# Patient Record
Sex: Male | Born: 1950 | Hispanic: No | Marital: Married | Smoking: Former smoker
Health system: Southern US, Community
[De-identification: ages and names within clinical notes are randomized; demographics above are authoritative.]

## PROBLEM LIST (undated history)

## (undated) DIAGNOSIS — M5030 Other cervical disc degeneration, unspecified cervical region: Secondary | ICD-10-CM

## (undated) DIAGNOSIS — E785 Hyperlipidemia, unspecified: Secondary | ICD-10-CM

## (undated) DIAGNOSIS — M1990 Unspecified osteoarthritis, unspecified site: Secondary | ICD-10-CM

## (undated) DIAGNOSIS — L409 Psoriasis, unspecified: Secondary | ICD-10-CM

## (undated) HISTORY — PX: VASECTOMY: SHX75

## (undated) HISTORY — PX: COLONOSCOPY: SHX174

---

## 2005-04-04 ENCOUNTER — Ambulatory Visit: Payer: Self-pay | Admitting: Internal Medicine

## 2005-08-12 ENCOUNTER — Ambulatory Visit: Payer: Self-pay | Admitting: Gastroenterology

## 2007-05-14 ENCOUNTER — Ambulatory Visit: Payer: Self-pay | Admitting: Internal Medicine

## 2008-05-30 ENCOUNTER — Ambulatory Visit: Payer: Self-pay | Admitting: Internal Medicine

## 2011-06-02 ENCOUNTER — Ambulatory Visit: Payer: Self-pay | Admitting: Internal Medicine

## 2011-06-14 ENCOUNTER — Encounter: Payer: Self-pay | Admitting: Physician Assistant

## 2011-06-24 ENCOUNTER — Encounter: Payer: Self-pay | Admitting: Physician Assistant

## 2011-07-25 ENCOUNTER — Encounter: Payer: Self-pay | Admitting: Physician Assistant

## 2011-08-24 ENCOUNTER — Encounter: Payer: Self-pay | Admitting: Physician Assistant

## 2011-09-24 ENCOUNTER — Encounter: Payer: Self-pay | Admitting: Physician Assistant

## 2012-06-15 ENCOUNTER — Other Ambulatory Visit: Payer: Self-pay | Admitting: Internal Medicine

## 2012-06-15 LAB — CBC WITH DIFFERENTIAL/PLATELET
Basophil #: 0.1 10*3/uL (ref 0.0–0.1)
Eosinophil #: 0.2 10*3/uL (ref 0.0–0.7)
Eosinophil %: 2.5 %
HCT: 44.6 % (ref 40.0–52.0)
MCH: 30.2 pg (ref 26.0–34.0)
MCHC: 34 g/dL (ref 32.0–36.0)
MCV: 89 fL (ref 80–100)
Monocyte #: 0.8 x10 3/mm (ref 0.2–1.0)
Neutrophil %: 46.5 %
Platelet: 290 10*3/uL (ref 150–440)
RBC: 5.02 10*6/uL (ref 4.40–5.90)
RDW: 12.9 % (ref 11.5–14.5)
WBC: 7.1 10*3/uL (ref 3.8–10.6)

## 2012-06-15 LAB — URINALYSIS, COMPLETE
Glucose,UR: NEGATIVE mg/dL (ref 0–75)
Ketone: NEGATIVE
Leukocyte Esterase: NEGATIVE
Ph: 6 (ref 4.5–8.0)
Protein: NEGATIVE
Specific Gravity: 1.014 (ref 1.003–1.030)
Squamous Epithelial: NONE SEEN
WBC UR: <1 /HPF (ref 0–5)

## 2012-06-15 LAB — COMPREHENSIVE METABOLIC PANEL
Anion Gap: 5 — ABNORMAL LOW (ref 7–16)
Calcium, Total: 8.8 mg/dL (ref 8.5–10.1)
Chloride: 104 mmol/L (ref 98–107)
Co2: 29 mmol/L (ref 21–32)
Creatinine: 0.92 mg/dL (ref 0.60–1.30)
EGFR (African American): >60
EGFR (Non-African Amer.): >60
SGOT(AST): 26 U/L (ref 15–37)
SGPT (ALT): 40 U/L (ref 12–78)

## 2012-06-15 LAB — TSH: Thyroid Stimulating Horm: 2.67 u[IU]/mL

## 2012-06-16 LAB — PSA: PSA: 2.3 ng/mL (ref 0.0–4.0)

## 2013-02-19 IMAGING — CR DG SHOULDER 3+V*L*
1 series · 4 of 4 positions shown · non-contrast
Comparison: none

REASON FOR EXAM: left shoulder pain
COMMENTS:

PROCEDURE:     DXR - DXR SHOULDER LEFT COMPLETE  - June 02, 2011 [DATE]
RESULT:     No fracture, dislocation or other acute bony abnormality is
identified. No lytic or blastic lesions are seen. The acromioclavicular
joint is normal in appearance.

[Series 1: w shoulder external left · 0.14mm/px · 4 of 4 slices shown]
[im 1/4]
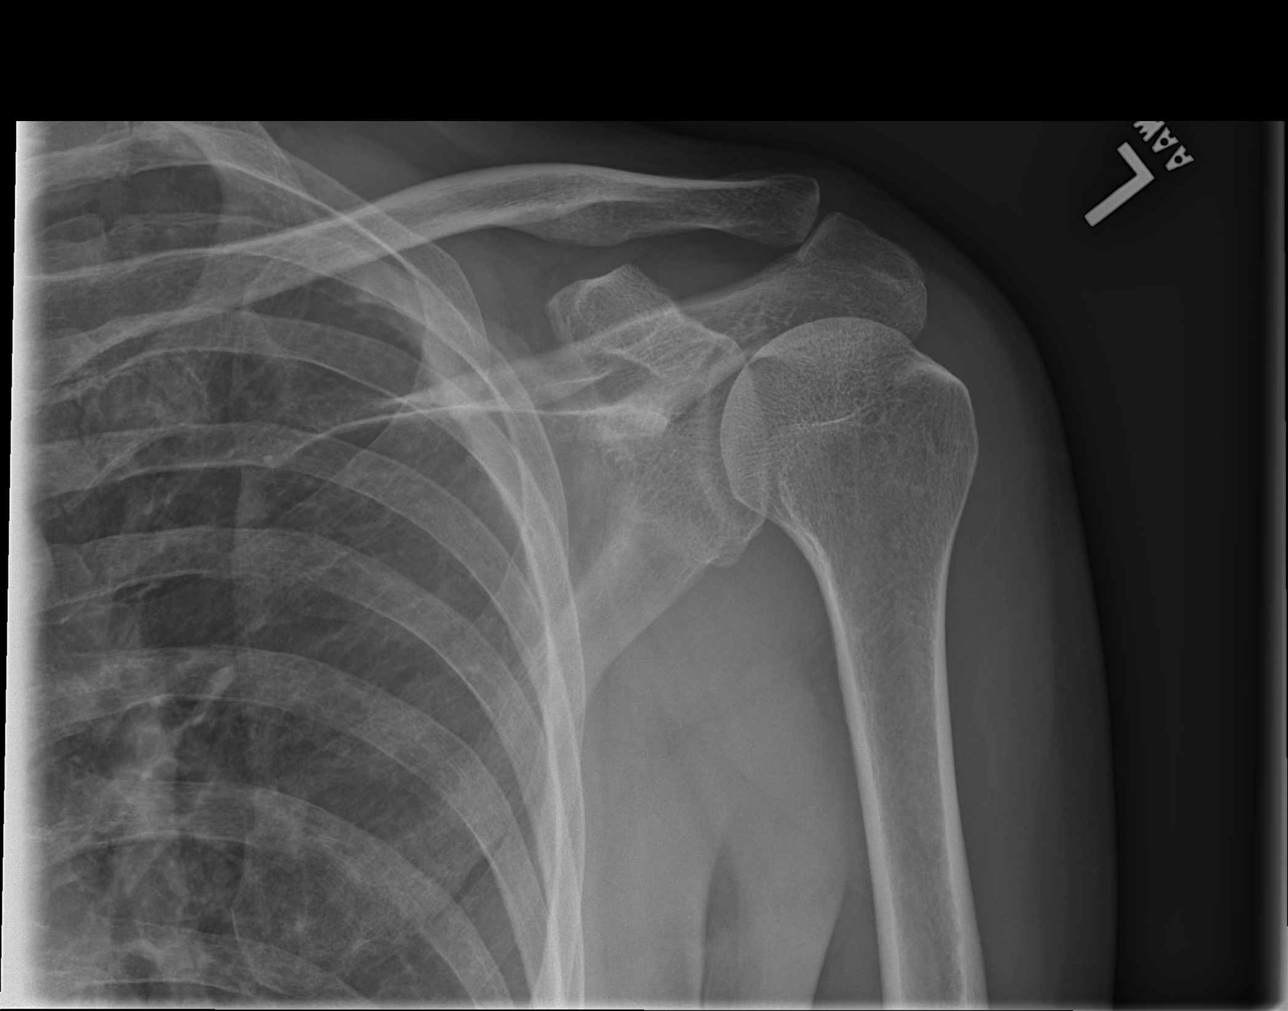
[im 2/4]
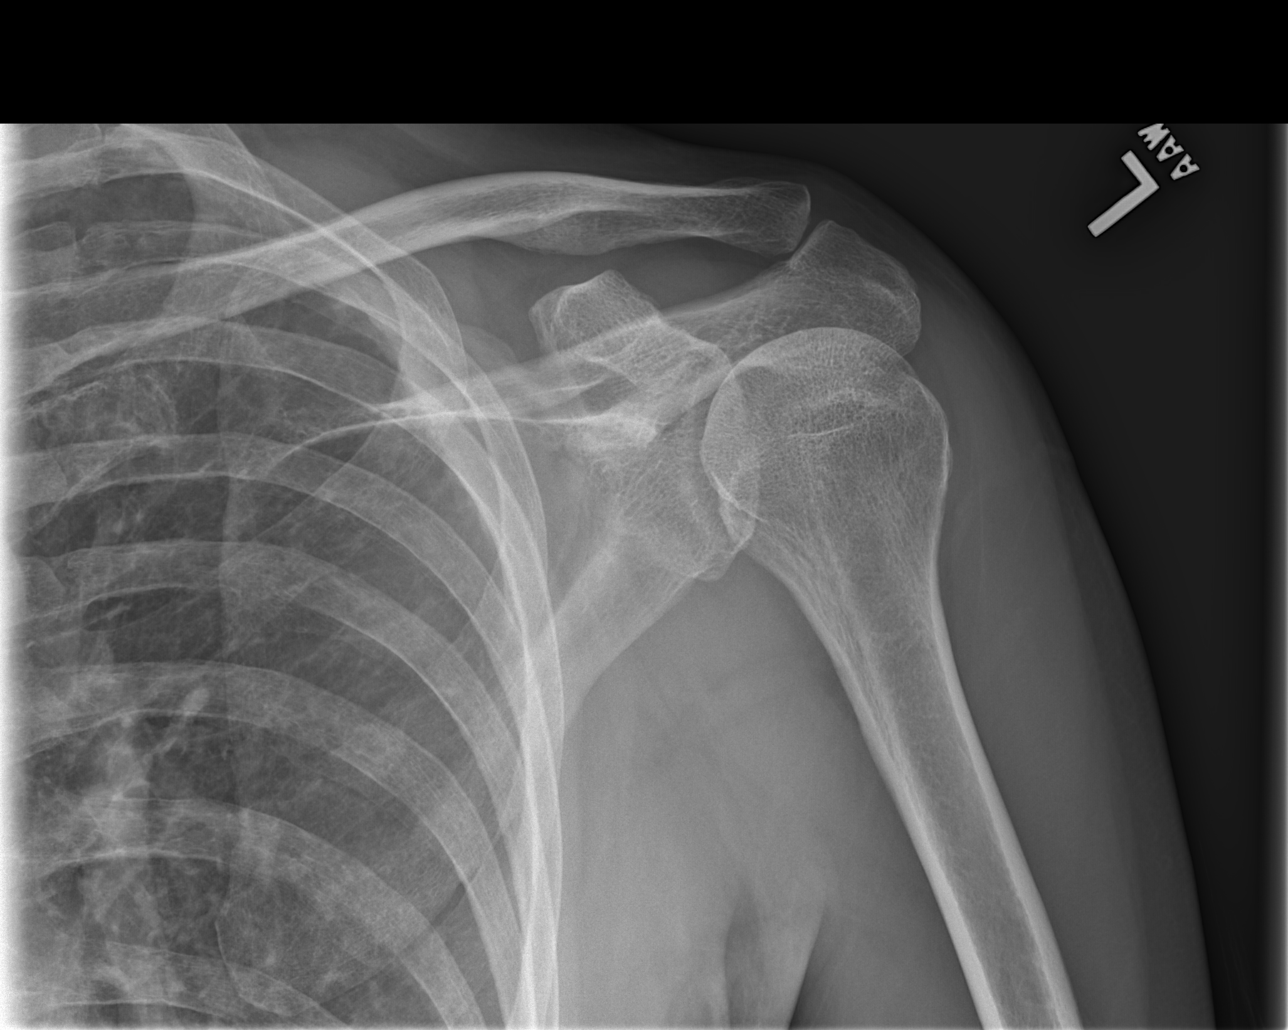
[im 3/4]
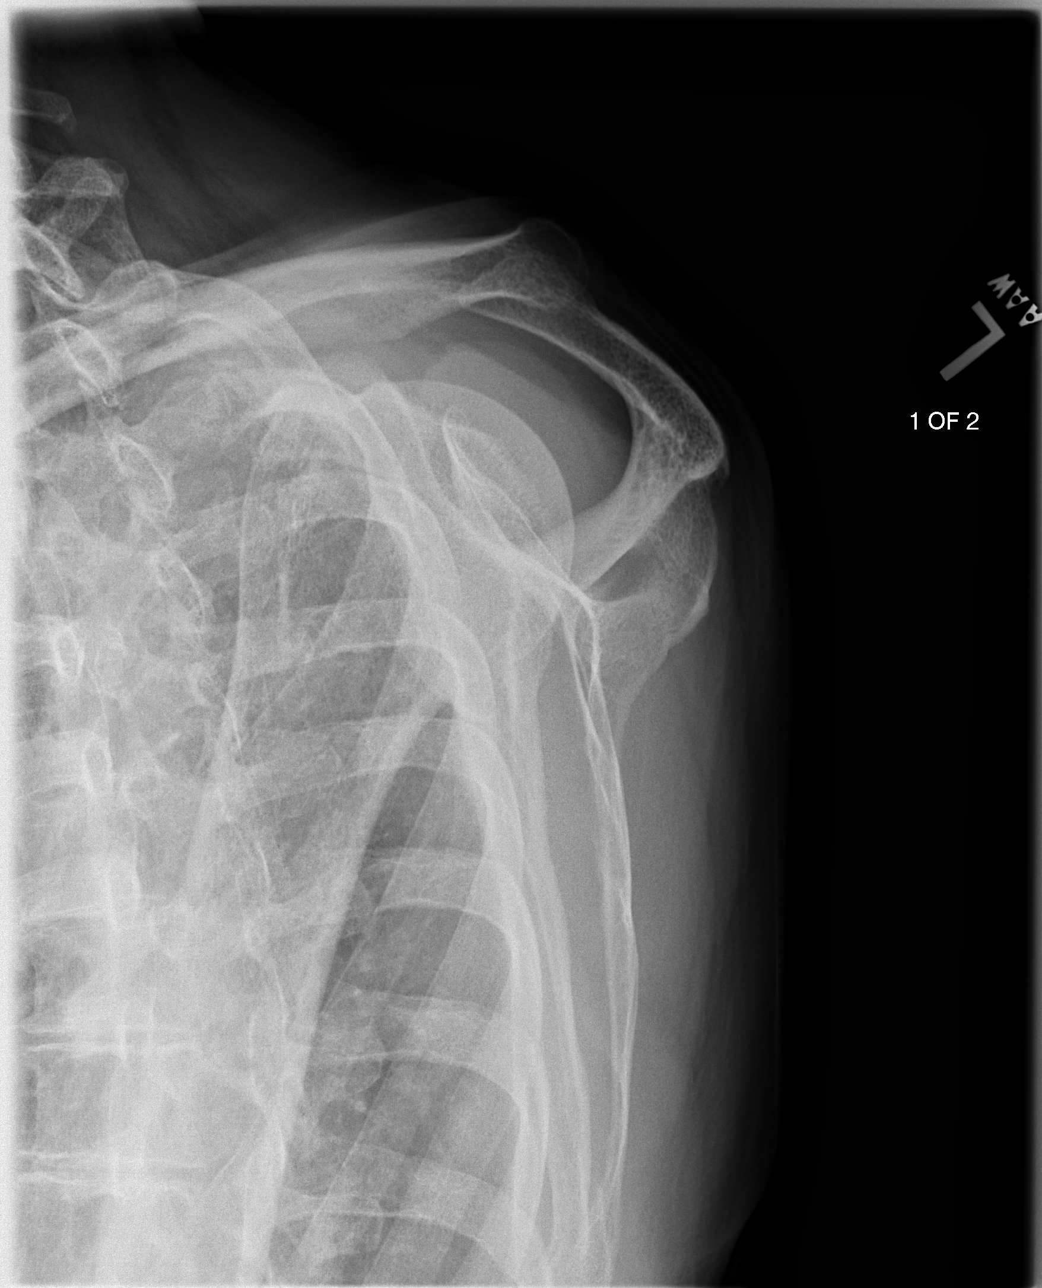
[im 4/4]
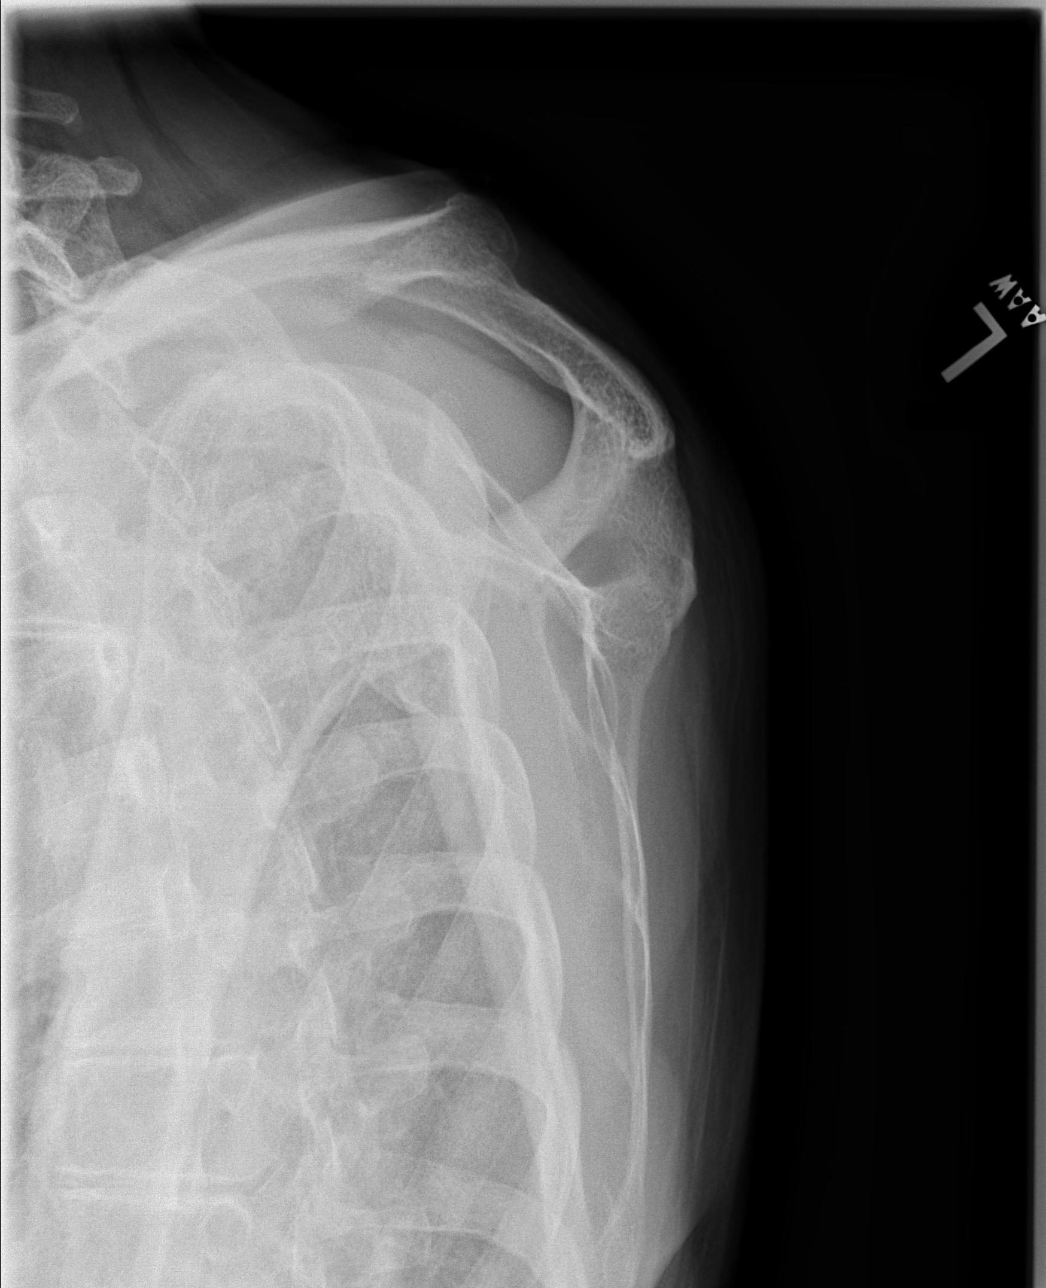

[4 of 4 positions shown; findings below may reference images not displayed]

IMPRESSION: 1.     No significant abnormalities are noted.

## 2013-06-24 ENCOUNTER — Other Ambulatory Visit: Payer: Self-pay | Admitting: Internal Medicine

## 2013-06-24 LAB — BASIC METABOLIC PANEL
ANION GAP: 1 — AB (ref 7–16)
BUN: 17 mg/dL (ref 7–18)
CHLORIDE: 104 mmol/L (ref 98–107)
Calcium, Total: 9.1 mg/dL (ref 8.5–10.1)
Co2: 30 mmol/L (ref 21–32)
Creatinine: 0.89 mg/dL (ref 0.60–1.30)
EGFR (Non-African Amer.): >60
Glucose: 87 mg/dL (ref 65–99)
Osmolality: 271 (ref 275–301)
POTASSIUM: 4.3 mmol/L (ref 3.5–5.1)
Sodium: 135 mmol/L — ABNORMAL LOW (ref 136–145)

## 2013-06-24 LAB — URINALYSIS, COMPLETE
Bacteria: NONE SEEN
Bilirubin,UR: NEGATIVE
Blood: NEGATIVE
GLUCOSE, UR: NEGATIVE mg/dL (ref 0–75)
Ketone: NEGATIVE
Leukocyte Esterase: NEGATIVE
NITRITE: NEGATIVE
Ph: 6 (ref 4.5–8.0)
Protein: NEGATIVE
RBC,UR: NONE SEEN /HPF (ref 0–5)
SPECIFIC GRAVITY: 1.014 (ref 1.003–1.030)
Squamous Epithelial: 1
WBC UR: 1 /HPF (ref 0–5)

## 2013-06-24 LAB — CBC WITH DIFFERENTIAL/PLATELET
BASOS ABS: 0.1 10*3/uL (ref 0.0–0.1)
Basophil %: 1.3 %
Eosinophil #: 0.5 10*3/uL (ref 0.0–0.7)
Eosinophil %: 7.2 %
HCT: 43.3 % (ref 40.0–52.0)
HGB: 14.4 g/dL (ref 13.0–18.0)
Lymphocyte #: 2.3 10*3/uL (ref 1.0–3.6)
Lymphocyte %: 31.5 %
MCH: 29.6 pg (ref 26.0–34.0)
MCHC: 33.4 g/dL (ref 32.0–36.0)
MCV: 89 fL (ref 80–100)
MONO ABS: 0.7 x10 3/mm (ref 0.2–1.0)
MONOS PCT: 10 %
NEUTROS PCT: 50 %
Neutrophil #: 3.6 10*3/uL (ref 1.4–6.5)
Platelet: 282 10*3/uL (ref 150–440)
RBC: 4.87 10*6/uL (ref 4.40–5.90)
RDW: 12.6 % (ref 11.5–14.5)
WBC: 7.3 10*3/uL (ref 3.8–10.6)

## 2013-06-24 LAB — HEPATIC FUNCTION PANEL A (ARMC)
ALBUMIN: 3.6 g/dL (ref 3.4–5.0)
ALK PHOS: 58 U/L
BILIRUBIN TOTAL: 0.3 mg/dL (ref 0.2–1.0)
SGOT(AST): 23 U/L (ref 15–37)
SGPT (ALT): 32 U/L (ref 12–78)
Total Protein: 7.2 g/dL (ref 6.4–8.2)

## 2013-06-24 LAB — TSH: Thyroid Stimulating Horm: 2.07 u[IU]/mL

## 2013-06-24 LAB — LIPID PANEL
CHOLESTEROL: 186 mg/dL (ref 0–200)
HDL Cholesterol: 40 mg/dL (ref 40–60)
Ldl Cholesterol, Calc: 124 mg/dL — ABNORMAL HIGH (ref 0–100)
Triglycerides: 109 mg/dL (ref 0–200)
VLDL CHOLESTEROL, CALC: 22 mg/dL (ref 5–40)

## 2013-06-25 LAB — PSA: PSA: 2.3 ng/mL (ref 0.0–4.0)

## 2015-12-17 ENCOUNTER — Encounter: Payer: Self-pay | Admitting: *Deleted

## 2015-12-18 ENCOUNTER — Ambulatory Visit: Payer: 59 | Admitting: Anesthesiology

## 2015-12-18 ENCOUNTER — Encounter
Admission: RE | Disposition: A | Discharge status: Home / Self Care | Payer: Self-pay | Source: Ambulatory Visit | Attending: Gastroenterology

## 2015-12-18 ENCOUNTER — Ambulatory Visit
Admission: RE | Admit: 2015-12-18 | Discharge: 2015-12-18 | Disposition: A | Discharge status: Home / Self Care | Payer: 59 | Source: Ambulatory Visit | Attending: Gastroenterology | Admitting: Gastroenterology

## 2015-12-18 ENCOUNTER — Encounter: Payer: Self-pay | Admitting: Anesthesiology

## 2015-12-18 DIAGNOSIS — M5030 Other cervical disc degeneration, unspecified cervical region: Secondary | ICD-10-CM | POA: Diagnosis not present

## 2015-12-18 DIAGNOSIS — Z1211 Encounter for screening for malignant neoplasm of colon: Secondary | ICD-10-CM | POA: Diagnosis not present

## 2015-12-18 DIAGNOSIS — Z79899 Other long term (current) drug therapy: Secondary | ICD-10-CM | POA: Diagnosis not present

## 2015-12-18 DIAGNOSIS — K621 Rectal polyp: Secondary | ICD-10-CM | POA: Insufficient documentation

## 2015-12-18 DIAGNOSIS — E785 Hyperlipidemia, unspecified: Secondary | ICD-10-CM | POA: Diagnosis not present

## 2015-12-18 DIAGNOSIS — M1990 Unspecified osteoarthritis, unspecified site: Secondary | ICD-10-CM | POA: Insufficient documentation

## 2015-12-18 DIAGNOSIS — Z87891 Personal history of nicotine dependence: Secondary | ICD-10-CM | POA: Insufficient documentation

## 2015-12-18 DIAGNOSIS — L409 Psoriasis, unspecified: Secondary | ICD-10-CM | POA: Diagnosis not present

## 2015-12-18 HISTORY — DX: Unspecified osteoarthritis, unspecified site: M19.90

## 2015-12-18 HISTORY — PX: COLONOSCOPY WITH PROPOFOL: SHX5780

## 2015-12-18 HISTORY — DX: Hyperlipidemia, unspecified: E78.5

## 2015-12-18 HISTORY — DX: Other cervical disc degeneration, unspecified cervical region: M50.30

## 2015-12-18 HISTORY — DX: Psoriasis, unspecified: L40.9

## 2015-12-18 SURGERY — COLONOSCOPY WITH PROPOFOL
Anesthesia: General

## 2015-12-18 MED ORDER — PROPOFOL 10 MG/ML IV BOLUS
INTRAVENOUS | Status: DC | PRN
  Administered 2015-12-18: 40 mg via INTRAVENOUS

## 2015-12-18 MED ORDER — FENTANYL CITRATE (PF) 100 MCG/2ML IJ SOLN
INTRAMUSCULAR | Status: DC | PRN
  Administered 2015-12-18: 50 ug via INTRAVENOUS

## 2015-12-18 MED ORDER — SODIUM CHLORIDE 0.9 % IV SOLN
INTRAVENOUS | Status: DC
  Administered 2015-12-18: 08:00:00 via INTRAVENOUS
  Administered 2015-12-18: 1000 mL via INTRAVENOUS

## 2015-12-18 MED ORDER — LIDOCAINE HCL (PF) 2 % IJ SOLN
INTRAMUSCULAR | Status: DC | PRN
  Administered 2015-12-18: 40 mg via INTRADERMAL

## 2015-12-18 MED ORDER — DEXAMETHASONE SODIUM PHOSPHATE 10 MG/ML IJ SOLN
INTRAMUSCULAR | Status: DC | PRN
  Administered 2015-12-18: 140 mg via INTRAVENOUS

## 2015-12-18 MED ORDER — EPHEDRINE SULFATE 50 MG/ML IJ SOLN
INTRAMUSCULAR | Status: DC | PRN
  Administered 2015-12-18: 10 mg via INTRAVENOUS

## 2015-12-18 MED ORDER — MIDAZOLAM HCL 2 MG/2ML IJ SOLN
INTRAMUSCULAR | Status: DC | PRN
  Administered 2015-12-18: 1 mg via INTRAVENOUS

## 2015-12-18 MED ORDER — SODIUM CHLORIDE 0.9 % IV SOLN: INTRAVENOUS | Status: DC

## 2015-12-18 NOTE — Op Note (Signed)
[facility] Gastroenterology Patient Name: Billy Ryan Procedure Date: 12/18/2015 8:30 AM MRN: [id number] Account #: [id number] Date of Birth: Feb 19, 1951 Admit Type: Outpatient Age: [age] Room: [facility] END[facility] ROOM 1 Gender: Male Note Status: Finalized Procedure:            Colonoscopy Providers:            Christena DeemMartin U. Skulskie, MD Referring MD:         Daniel NonesBert Klein, MD (Referring MD) Medicines:            Monitored Anesthesia Care Complications:        No immediate complications. Procedure:            Pre-Anesthesia Assessment:                       - ASA Grade Assessment: I - A normal, healthy patient.                       After obtaining informed consent, the colonoscope was                        passed under direct vision. Throughout the procedure,                        the patient's blood pressure, pulse, and oxygen                        saturations were monitored continuously. The                        Colonoscope was introduced through the anus and                        advanced to the the cecum, identified by appendiceal                        orifice and ileocecal valve. The colonoscopy was                        performed with moderate difficulty. Successful                        completion of the procedure was aided by changing the                        patient to a prone position. The patient tolerated the                        procedure well. The quality of the bowel preparation                        was good. Findings:      A 3 mm polyp was found in the rectum. The polyp was sessile. The polyp       was removed with a cold biopsy forceps. Resection and retrieval were       complete.      The exam was otherwise without abnormality.      No additional abnormalities were found on retroflexion.      The digital rectal exam was normal. Impression:           -  One 3 mm polyp in the rectum, removed with a cold                        biopsy forceps.  Resected and retrieved.                       - The examination was otherwise normal. Recommendation:       - Discharge patient to home. Procedure Code(s):    --- Professional ---                       [phone], Colonoscopy, flexible; with biopsy, single or                        multiple Diagnosis Code(s):    --- Professional ---                       K62.1, Rectal polyp CPT copyright 2016 American Medical Association. All rights reserved. The codes documented in this report are preliminary and upon coder review may  be revised to meet current compliance requirements. Christena Deem, MD 12/18/2015 9:10:09 AM This report has been signed electronically. Number of Addenda: 0 Note Initiated On: 12/18/2015 8:30 AM Scope Withdrawal Time: 0 hours 9 minutes 38 seconds  Total Procedure Duration: 0 hours 22 minutes 56 seconds       [facility]

## 2015-12-18 NOTE — H&P (Signed)
Outpatient short stay form Pre-procedure 12/18/2015 8:26 AM Billy DeemMartin U Skulskie MD  Primary Physician: Daniel NonesBert Klein  Reason for visit: Colonoscopy   History of present illness:  Patient is a 65 year old male presenting today as above. He tolerated his prep well. He takes no aspirin or blood thinning products.     Current Facility-Administered Medications:  .  0.9 %  sodium chloride infusion, , Intravenous, Continuous, Billy DeemMartin U Skulskie, MD, Last Rate: 20 mL/hr at 12/18/15 0755, 1,000 mL at 12/18/15 0755 .  0.9 %  sodium chloride infusion, , Intravenous, Continuous, Billy DeemMartin U Skulskie, MD  Prescriptions Prior to Admission  Medication Sig Dispense Refill Last Dose  . ibuprofen (ADVIL,MOTRIN) 200 MG tablet Take 200 mg by mouth every 6 (six) hours as needed.   Past Week at Unknown time  . polyethylene glycol powder (GLYCOLAX/MIRALAX) powder Take 1 Container by mouth once.   12/17/2015 at Unknown time  . sildenafil (VIAGRA) 100 MG tablet Take 100 mg by mouth daily as needed for erectile dysfunction.   Past Month at Unknown time     No Known Allergies   Past Medical History:  Diagnosis Date  . DDD (degenerative disc disease), cervical    MULTIPLE SITES  . Hyperlipemia   . Osteoarthritis    RT.SHOULDER  . Psoriasis     Review of systems:      Physical Exam    Heart and lungs: Rate and rhythm without rub or gallop, lungs are bilaterally clear.    HEENT: Normocephalic atraumatic eyes are anicteric    Other:     Pertinant exam for procedure: Soft nontender nondistended bowel sounds positive normoactive.    Planned proceedures: Colonoscopy and indicated procedures.have discussed the risks benefits and complications of procedures to include not limited to bleeding, infection, perforation and the risk of sedation and the patient wishes to proceed.    Billy DeemMartin U Skulskie, MD Gastroenterology 12/18/2015  8:26 AM

## 2015-12-18 NOTE — Anesthesia Postprocedure Evaluation (Signed)
Anesthesia Post Note  Patient: Billy LeydenChristopher M Ryan  Procedure(s) Performed: Procedure(s) (LRB): COLONOSCOPY WITH PROPOFOL (N/A)  Patient location during evaluation: Endoscopy Anesthesia Type: General Level of consciousness: awake and alert Pain management: pain level controlled Vital Signs Assessment: post-procedure vital signs reviewed and stable Respiratory status: spontaneous breathing, nonlabored ventilation, respiratory function stable and patient connected to nasal cannula oxygen Cardiovascular status: blood pressure returned to baseline and stable Postop Assessment: no signs of nausea or vomiting Anesthetic complications: no    Last Vitals:  Vitals:   12/18/15 0940 12/18/15 0950  BP: 115/69 113/76  Pulse: 69 60  Resp: 16 15  Temp:      Last Pain:  Vitals:   12/18/15 0917  TempSrc: Tympanic                 Lenard SimmerAndrew Bryceton Hantz

## 2015-12-18 NOTE — Transfer of Care (Signed)
Immediate Anesthesia Transfer of Care Note  Patient: Billy LeydenChristopher M Cerutti  Procedure(s) Performed: Procedure(s): COLONOSCOPY WITH PROPOFOL (N/A)  Patient Location: PACU  Anesthesia Type:General  Level of Consciousness: sedated  Airway & Oxygen Therapy: Patient Spontanous Breathing and Patient connected to nasal cannula oxygen  Post-op Assessment: Report given to RN and Post -op Vital signs reviewed and stable  Post vital signs: Reviewed and stable  Last Vitals:  Vitals:   12/18/15 0739  BP: 104/68  Pulse: 73  Resp: 16  Temp: (!) 35.9 C    Last Pain:  Vitals:   12/18/15 0739  TempSrc: Tympanic         Complications: No apparent anesthesia complications

## 2015-12-18 NOTE — Anesthesia Procedure Notes (Signed)
Date/Time: 12/18/2015 8:50 AM Performed by: Henrietta HooverPOPE, Trishna Cwik Pre-anesthesia Checklist: Patient identified, Emergency Drugs available, Suction available, Patient being monitored and Timeout performed Patient Re-evaluated:Patient Re-evaluated prior to inductionOxygen Delivery Method: Nasal cannula Intubation Type: IV induction

## 2015-12-18 NOTE — Anesthesia Preprocedure Evaluation (Signed)
Anesthesia Evaluation  Patient identified by MRN, date of birth, ID band Patient awake    Reviewed: Allergy & Precautions, H&P , NPO status , Patient's Chart, lab work & pertinent test results, reviewed documented beta blocker date and time   History of Anesthesia Complications Negative for: history of anesthetic complications  Airway Mallampati: I  TM Distance: >3 FB Neck ROM: full    Dental no notable dental hx. (+) Caps, Poor Dentition   Pulmonary neg pulmonary ROS, former smoker,    Pulmonary exam normal breath sounds clear to auscultation       Cardiovascular Exercise Tolerance: Good negative cardio ROS Normal cardiovascular exam Rhythm:regular Rate:Normal     Neuro/Psych negative neurological ROS  negative psych ROS   GI/Hepatic negative GI ROS, Neg liver ROS,   Endo/Other  negative endocrine ROS  Renal/GU negative Renal ROS  negative genitourinary   Musculoskeletal   Abdominal   Peds  Hematology negative hematology ROS (+)   Anesthesia Other Findings Past Medical History: No date: DDD (degenerative disc disease), cervical     Comment: MULTIPLE SITES No date: Hyperlipemia No date: Osteoarthritis     Comment: RT.SHOULDER No date: Psoriasis   Reproductive/Obstetrics negative OB ROS                             Anesthesia Physical Anesthesia Plan  ASA: I  Anesthesia Plan: General   Post-op Pain Management:    Induction:   Airway Management Planned:   Additional Equipment:   Intra-op Plan:   Post-operative Plan:   Informed Consent: I have reviewed the patients History and Physical, chart, labs and discussed the procedure including the risks, benefits and alternatives for the proposed anesthesia with the patient or authorized representative who has indicated his/her understanding and acceptance.   Dental Advisory Given  Plan Discussed with: Anesthesiologist, CRNA  and Surgeon  Anesthesia Plan Comments:         Anesthesia Quick Evaluation

## 2015-12-21 ENCOUNTER — Encounter: Payer: Self-pay | Admitting: Gastroenterology

## 2015-12-21 LAB — SURGICAL PATHOLOGY

## 2016-03-04 ENCOUNTER — Emergency Department (INDEPENDENT_AMBULATORY_CARE_PROVIDER_SITE_OTHER): Payer: 59

## 2016-03-04 ENCOUNTER — Emergency Department (INDEPENDENT_AMBULATORY_CARE_PROVIDER_SITE_OTHER)
Admission: EM | Admit: 2016-03-04 | Discharge: 2016-03-04 | Disposition: A | Discharge status: Home / Self Care | Payer: 59 | Source: Home / Self Care | Attending: Family Medicine | Admitting: Family Medicine

## 2016-03-04 ENCOUNTER — Encounter: Payer: Self-pay | Admitting: Emergency Medicine

## 2016-03-04 DIAGNOSIS — R05 Cough: Secondary | ICD-10-CM | POA: Diagnosis not present

## 2016-03-04 DIAGNOSIS — J40 Bronchitis, not specified as acute or chronic: Secondary | ICD-10-CM | POA: Diagnosis not present

## 2016-03-04 DIAGNOSIS — R509 Fever, unspecified: Secondary | ICD-10-CM | POA: Diagnosis not present

## 2016-03-04 LAB — POCT RAPID STREP A (OFFICE): RAPID STREP A SCREEN: NEGATIVE

## 2016-03-04 MED ORDER — AZITHROMYCIN 250 MG PO TABS: ORAL_TABLET | ORAL | 0 refills | Status: AC

## 2016-03-04 NOTE — ED Provider Notes (Signed)
[facility]    CSN: [id number] Arrival date & time: 03/04/16  0854     History   Chief Complaint Chief Complaint  Patient presents with  . Sore Throat    HPI Billy LeydenChristopher M Ryan is a 65 y.o. male.   About 9 days ago patient developed typical cold-like symptoms developing over several days, including mild sore throat, sinus congestion, headache, fatigue, and cough.  During the past 2 to 3 days he has become increasingly fatigued with myalgias, and temperature last night increased to 101.  His cough is poorly productive, and awakens him at night.  No pleuritic pain or shortness of breath.   The history is provided by the patient.    Past Medical History:  Diagnosis Date  . DDD (degenerative disc disease), cervical    MULTIPLE SITES  . Hyperlipemia   . Osteoarthritis    RT.SHOULDER  . Psoriasis     There are no active problems to display for this patient.   Past Surgical History:  Procedure Laterality Date  . COLONOSCOPY    . COLONOSCOPY WITH PROPOFOL N/A 12/18/2015   Procedure: COLONOSCOPY WITH PROPOFOL;  Surgeon: Christena DeemMartin U Skulskie, MD;  [facility] ENDOSCOPY;  Service: Endoscopy;  Laterality: N/A;  . VASECTOMY         Home Medications    Prior to Admission medications   Medication Sig Start Date End Date Taking? Authorizing Provider  Dextromethorphan-Guaifenesin (DELSYM COUGH/CHEST CONGEST DM PO) Take by mouth.   Yes Historical Provider, MD  pseudoephedrine (SUDAFED) 30 MG tablet Take 30 mg by mouth every 4 (four) hours as needed for congestion.   Yes Historical Provider, MD  azithromycin (ZITHROMAX Z-PAK) 250 MG tablet Take 2 tabs today; then begin one tab once daily for 4 more days. 03/04/16   Lattie HawStephen A Mattheu Brodersen, MD  ibuprofen (ADVIL,MOTRIN) 200 MG tablet Take 200 mg by mouth every 6 (six) hours as needed.    Historical Provider, MD  polyethylene glycol powder (GLYCOLAX/MIRALAX) powder Take 1 Container by mouth once.    Historical Provider, MD    sildenafil (VIAGRA) 100 MG tablet Take 100 mg by mouth daily as needed for erectile dysfunction.    Historical Provider, MD    Family History No family history on file.  Social History Social History  Substance Use Topics  . Smoking status: Former Games developermoker  . Smokeless tobacco: Never Used  . Alcohol use Yes     Allergies   Patient has no known allergies.   Review of Systems Review of Systems + sore throat, improved + cough No pleuritic pain No wheezing + nasal congestion + post-nasal drainage No sinus pain/pressure No itchy/red eyes No earache No hemoptysis No SOB + fever, + chills No nausea No vomiting No abdominal pain No diarrhea No urinary symptoms No skin rash + fatigue + myalgias + headache Used OTC meds without relief   Physical Exam Triage Vital Signs ED Triage Vitals  Enc Vitals Group     BP 03/04/16 0921 116/66     Pulse Rate 03/04/16 0921 102     Resp --      Temp 03/04/16 0921 98.3 F (36.8 C)     Temp Source 03/04/16 0921 Oral     SpO2 03/04/16 0921 97 %     Weight 03/04/16 0921 174 lb (78.9 kg)     Height 03/04/16 0921 6\' 2"  (1.88 m)     Head Circumference --      Peak Flow --  Pain Score 03/04/16 0924 2     Pain Loc --      Pain Edu? --      Excl. in GC? --    No data found.   Updated Vital Signs BP 116/66 (BP Location: Left Arm)   Pulse 102   Temp 98.3 F (36.8 C) (Oral)   Ht 6\' 2"  (1.88 m)   Wt 174 lb (78.9 kg)   SpO2 97%   BMI 22.34 kg/m   Visual Acuity Right Eye Distance:   Left Eye Distance:   Bilateral Distance:    Right Eye Near:   Left Eye Near:    Bilateral Near:     Physical Exam Nursing notes and Vital Signs reviewed. Appearance:  Patient appears stated age, and in no acute distress Eyes:  Pupils are equal, round, and reactive to light and accomodation.  Extraocular movement is intact.  Conjunctivae are not inflamed  Ears:  Canals normal.  Tympanic membranes normal.  Nose:  Mildly congested  turbinates.  No sinus tenderness.  Pharynx:  Normal Neck:  Supple.  Tender enlarged posterior/lateral nodes are palpated bilaterally  Lungs:  Clear to auscultation.  Breath sounds are equal.  Moving air well. Heart:  Regular rate and rhythm without murmurs, rubs, or gallops.  Rate 108 Abdomen:  Nontender without masses or hepatosplenomegaly.  Bowel sounds are present.  No CVA or flank tenderness.  Extremities:  No edema.  Skin:  No rash present.    UC Treatments / Results  Labs (all labs ordered are listed, but only abnormal results are displayed) POCT rapid strep test negative  EKG  EKG Interpretation None       Radiology Dg Chest 2 View  Result Date: 03/04/2016 CLINICAL DATA:  Cough and fever. EXAM: CHEST  2 VIEW COMPARISON:  No recent prior . FINDINGS: Mediastinum is normal. Bilateral upper lobe nodular pleural parenchymal thickening with retraction of the hila noted consistent with scarring, possibly from prior granulomas disease. No focal alveolar infiltrate. No pleural effusion or pneumothorax. No acute bony abnormality . IMPRESSION: Bilateral upper lobe nodular pleural-parenchymal thickening with hilar retraction consistent with scarring, possibly from prior granulomas is a. No acute abnormality . Electronically Signed   By: Maisie Fus  Register   On: 03/04/2016 09:50    Procedures Procedures (including critical care time)  Medications Ordered in UC Medications - No data to display   Initial Impression / Assessment and Plan / UC Course  I have reviewed the triage vital signs and the nursing notes.  Pertinent labs & imaging results that were available during my care of the patient were reviewed by me and considered in my medical decision making (see chart for details).  Clinical Course   Suspect viral URI initially; late onset fever with increase cough worrisome for developing bacterial bronchitis. Will begin Z-pak for atypical coverage. Take plain guaifenesin (1200mg   extended release tabs such as Mucinex) twice daily, with plenty of water, for cough and congestion.  May add Pseudoephedrine (30mg , one or two every 4 to 6 hours) for sinus congestion.  Get adequate rest.   Stop all antihistamines for now, and other non-prescription cough/cold preparations. May take Ibuprofen 200mg , 4 tabs every 8 hours with food for fever, body aches, etc. May take Delsym Cough Suppressant at bedtime for nighttime cough.  Follow-up with family doctor if not improving about one week.     Final Clinical Impressions(s) / UC Diagnoses   Final diagnoses:  Bronchitis    New Prescriptions New  Prescriptions   AZITHROMYCIN (ZITHROMAX Z-PAK) 250 MG TABLET    Take 2 tabs today; then begin one tab once daily for 4 more days.     Lattie HawStephen A Sierria Bruney, MD 03/04/16 1020

## 2016-03-04 NOTE — Discharge Instructions (Signed)
Take plain guaifenesin (1200mg  extended release tabs such as Mucinex) twice daily, with plenty of water, for cough and congestion.  May add Pseudoephedrine (30mg , one or two every 4 to 6 hours) for sinus congestion.  Get adequate rest.   Stop all antihistamines for now, and other non-prescription cough/cold preparations. May take Ibuprofen 200mg , 4 tabs every 8 hours with food for fever, body aches, etc. May take Delsym Cough Suppressant at bedtime for nighttime cough.  Follow-up with family doctor if not improving about one week.

## 2016-03-04 NOTE — ED Triage Notes (Signed)
Sore throat, fever, productive cough, body aches x 6 days

## 2016-03-06 ENCOUNTER — Telehealth: Payer: Self-pay | Admitting: Emergency Medicine

## 2016-03-06 NOTE — Telephone Encounter (Signed)
Patient states he is feeling better

## 2017-06-04 ENCOUNTER — Emergency Department (INDEPENDENT_AMBULATORY_CARE_PROVIDER_SITE_OTHER)
Admission: EM | Admit: 2017-06-04 | Discharge: 2017-06-04 | Disposition: A | Discharge status: Home / Self Care | Payer: BLUE CROSS/BLUE SHIELD | Source: Home / Self Care

## 2017-06-04 ENCOUNTER — Encounter: Payer: Self-pay | Admitting: Emergency Medicine

## 2017-06-04 DIAGNOSIS — J0120 Acute ethmoidal sinusitis, unspecified: Secondary | ICD-10-CM

## 2017-06-04 MED ORDER — AMOXICILLIN-POT CLAVULANATE 875-125 MG PO TABS: 1.0000 | ORAL_TABLET | Freq: Two times a day (BID) | ORAL | 0 refills | Status: AC

## 2017-06-04 NOTE — ED Provider Notes (Signed)
[facility]    CSN: [id number] Arrival date & time: 06/04/17  1646     History   Chief Complaint Chief Complaint  Patient presents with  . Cough  . Nasal Congestion  . Headache  . Generalized Body Aches  . Fever    HPI Billy LeydenChristopher M Ryan is a 67 y.o. male.   The history is provided by the patient. No language interpreter was used.  Cough  Cough characteristics:  Productive Sputum characteristics:  Nondescript Severity:  Moderate Onset quality:  Gradual Duration:  1 week Timing:  Constant Progression:  Worsening Chronicity:  New Smoker: no   Relieved by:  Nothing Worsened by:  Nothing Associated symptoms: fever and headaches   Headache  Associated symptoms: cough and fever   Fever  Associated symptoms: cough and headaches     Past Medical History:  Diagnosis Date  . DDD (degenerative disc disease), cervical    MULTIPLE SITES  . Hyperlipemia   . Osteoarthritis    RT.SHOULDER  . Psoriasis     There are no active problems to display for this patient.   Past Surgical History:  Procedure Laterality Date  . COLONOSCOPY    . COLONOSCOPY WITH PROPOFOL N/A 12/18/2015   Procedure: COLONOSCOPY WITH PROPOFOL;  Surgeon: Christena DeemMartin U Skulskie, MD;  [facility] END[facility]COPY;  Service: Endoscopy;  Laterality: N/A;  . VASECTOMY         Home Medications    Prior to Admission medications   Medication Sig Start Date End Date Taking? Authorizing Provider  amoxicillin-clavulanate (AUGMENTIN) 875-125 MG tablet Take 1 tablet by mouth every 12 (twelve) hours. 06/04/17   Elson AreasSofia, Mardy Lucier K, PA-C  azithromycin (ZITHROMAX Z-PAK) 250 MG tablet Take 2 tabs today; then begin one tab once daily for 4 more days. 03/04/16   Lattie HawBeese, Stephen A, MD  Dextromethorphan-Guaifenesin (DELSYM COUGH/CHEST CONGEST DM PO) Take by mouth.    [provider]  ibuprofen (ADVIL,MOTRIN) 200 MG tablet Take 200 mg by mouth every 6 (six) hours as needed.    [provider]    polyethylene glycol powder (GLYCOLAX/MIRALAX) powder Take 1 Container by mouth once.    [provider]  pseudoephedrine (SUDAFED) 30 MG tablet Take 30 mg by mouth every 4 (four) hours as needed for congestion.    [provider]  sildenafil (VIAGRA) 100 MG tablet Take 100 mg by mouth daily as needed for erectile dysfunction.    [provider]    Family History History reviewed. No pertinent family history.  Social History Social History   Tobacco Use  . Smoking status: Former Games developermoker  . Smokeless tobacco: Never Used  Substance Use Topics  . Alcohol use: Yes  . Drug use: No     Allergies   Patient has no known allergies.   Review of Systems Review of Systems  Constitutional: Positive for fever.  Respiratory: Positive for cough.   Neurological: Positive for headaches.  All other systems reviewed and are negative.    Physical Exam Triage Vital Signs ED Triage Vitals  Enc Vitals Group     BP 06/04/17 1723 (!) 88/57     Pulse Rate 06/04/17 1723 80     Resp 06/04/17 1723 16     Temp 06/04/17 1723 98.8 F (37.1 C)     Temp Source 06/04/17 1723 Oral     SpO2 06/04/17 1723 98 %     Weight 06/04/17 1725 160 lb (72.6 kg)     Height 06/04/17 1725 6'  2" (1.88 m)     Head Circumference --      Peak Flow --      Pain Score 06/04/17 1724 6     Pain Loc --      Pain Edu? --      Excl. in GC? --    No data found.  Updated Vital Signs BP 95/60   Pulse 80   Temp 98.8 F (37.1 C) (Oral)   Resp 16   Ht 6\' 2"  (1.88 m)   Wt 160 lb (72.6 kg)   SpO2 98%   BMI 20.54 kg/m   Visual Acuity Right Eye Distance:   Left Eye Distance:   Bilateral Distance:    Right Eye Near:   Left Eye Near:    Bilateral Near:     Physical Exam  Constitutional: He appears well-developed and well-nourished.  HENT:  Head: Normocephalic and atraumatic.  Mouth/Throat: Oropharynx is clear and moist.  Eyes: Conjunctivae and EOM are normal. Pupils are equal, round,  and reactive to light.  Neck: Neck supple.  Cardiovascular: Normal rate and regular rhythm.  No murmur heard. Pulmonary/Chest: Effort normal and breath sounds normal. No respiratory distress.  Abdominal: Soft. There is no tenderness.  Musculoskeletal: He exhibits no edema.  Neurological: He is alert.  Skin: Skin is warm and dry.  Psychiatric: He has a normal mood and affect.  Nursing note and vitals reviewed.    UC Treatments / Results  Labs (all labs ordered are listed, but only abnormal results are displayed) Labs Reviewed - No data to display  EKG  EKG Interpretation None       Radiology No results found.  Procedures Procedures (including critical care time)  Medications Ordered in UC Medications - No data to display   Initial Impression / Assessment and Plan / UC Course  I have reviewed the triage vital signs and the nursing notes.  Pertinent labs & imaging results that were available during my care of the patient were reviewed by me and considered in my medical decision making (see chart for details).     MDM  Pt has had sinus symptoms for several weeks,  Pt now has a cough.  He reports fever this am.  BP low.  Pt feels normal no symptoms no tachycardia.  He reports he is drinking plenty of fluids.  I doubt influenza.  I am going to cover with Augmentin for sinustitis.  Final Clinical Impressions(s) / UC Diagnoses   Final diagnoses:  Acute ethmoidal sinusitis, recurrence not specified    ED Discharge Orders        Ordered    amoxicillin-clavulanate (AUGMENTIN) 875-125 MG tablet  Every 12 hours     06/04/17 1730     An After Visit Summary was printed and given to the patient.   Controlled Substance Prescriptions [place] Controlled Substance Registry consulted? Not Applicable   Elson Areas, [place] 06/04/17 1734

## 2017-06-04 NOTE — Discharge Instructions (Signed)
See your Physician or return here for recheck if symptoms persist past one week.

## 2017-06-04 NOTE — ED Triage Notes (Signed)
[facility] , cold ,sinus pressure ,headache, body aches, fever, Dry cough with occasional production. Chest congestion.

## 2017-11-22 IMAGING — DX DG CHEST 2V
2 series · 2 of 2 positions shown · non-contrast
Comparison: No recent prior .

CLINICAL DATA: Cough and fever.

EXAM:
CHEST  2 VIEW

[chest pa]
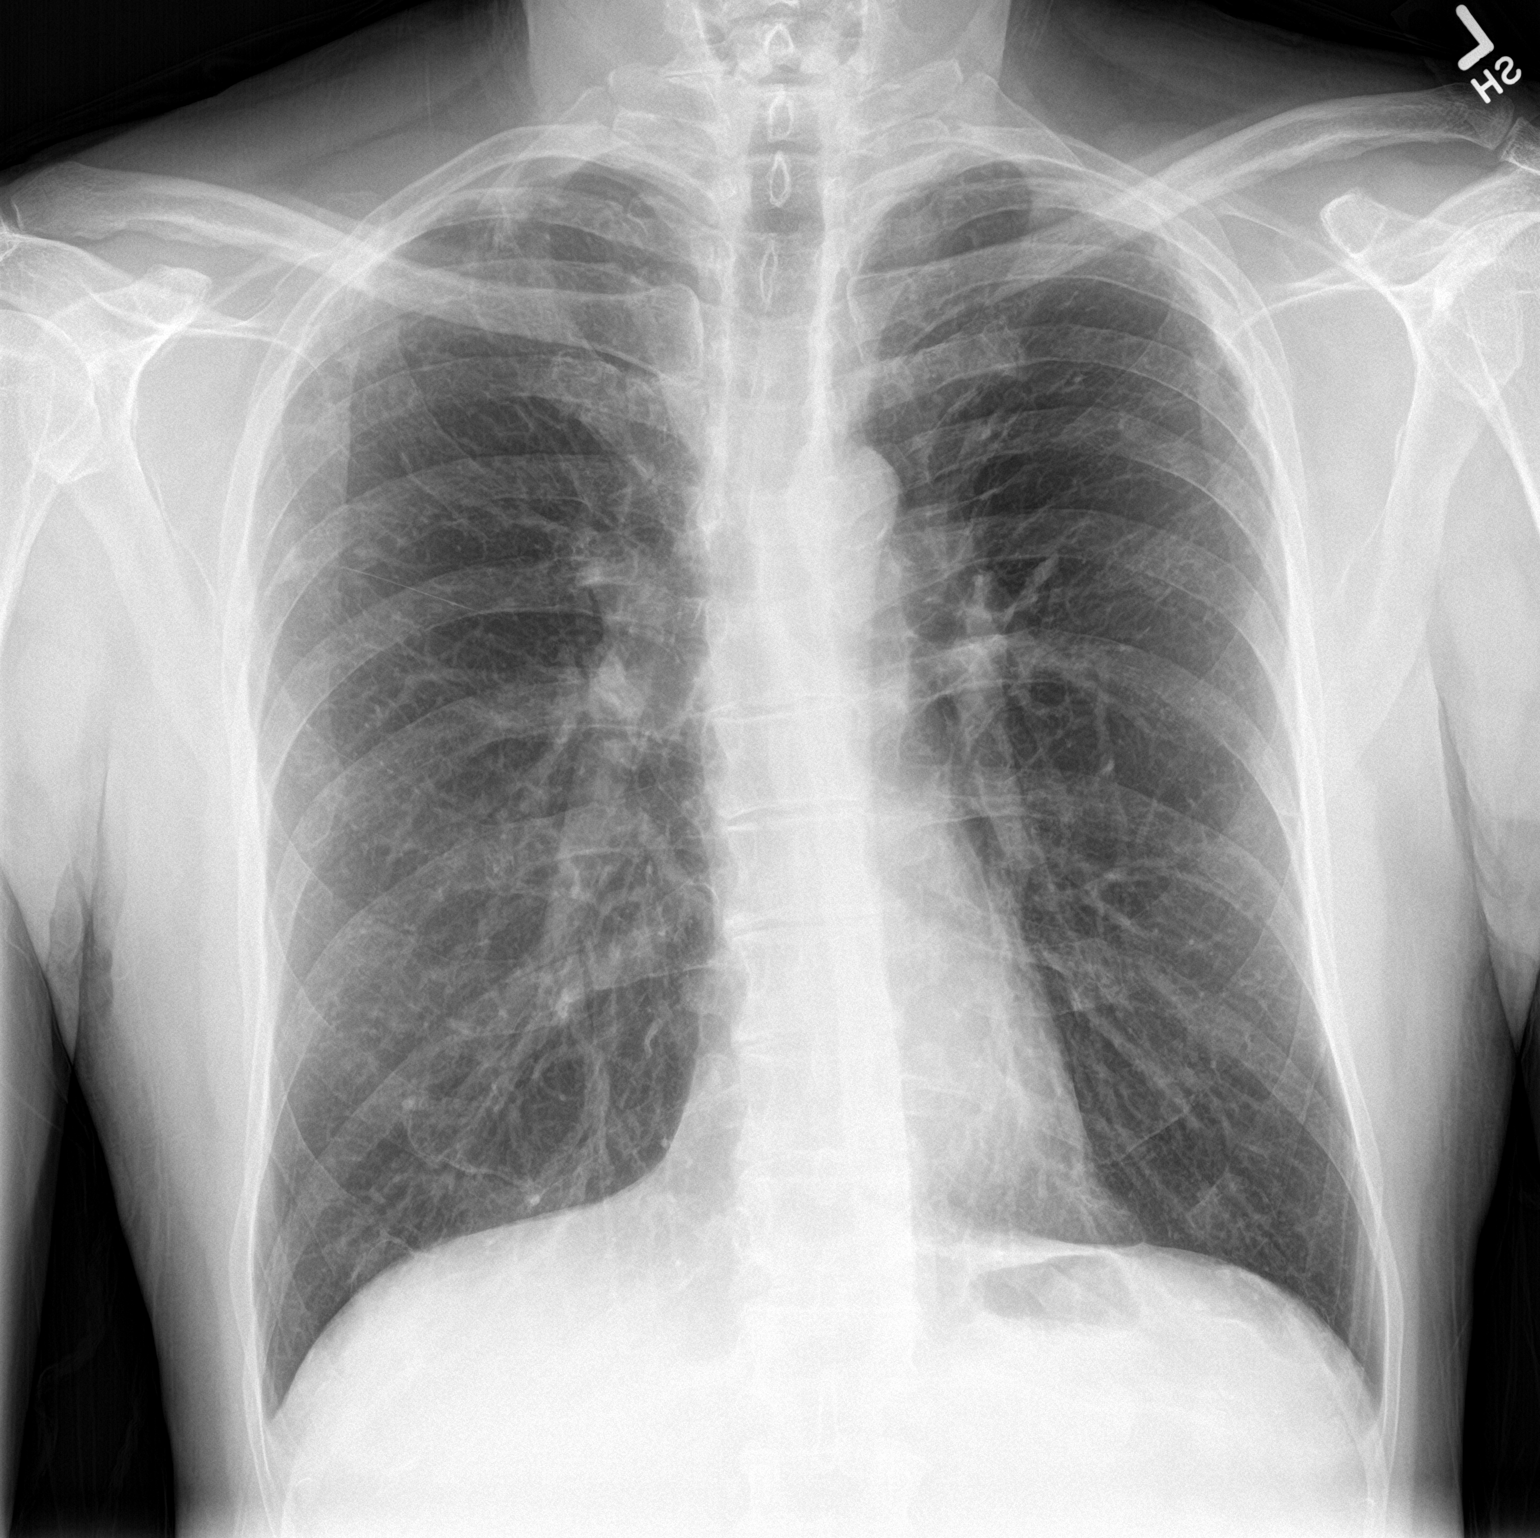

[chest lat]
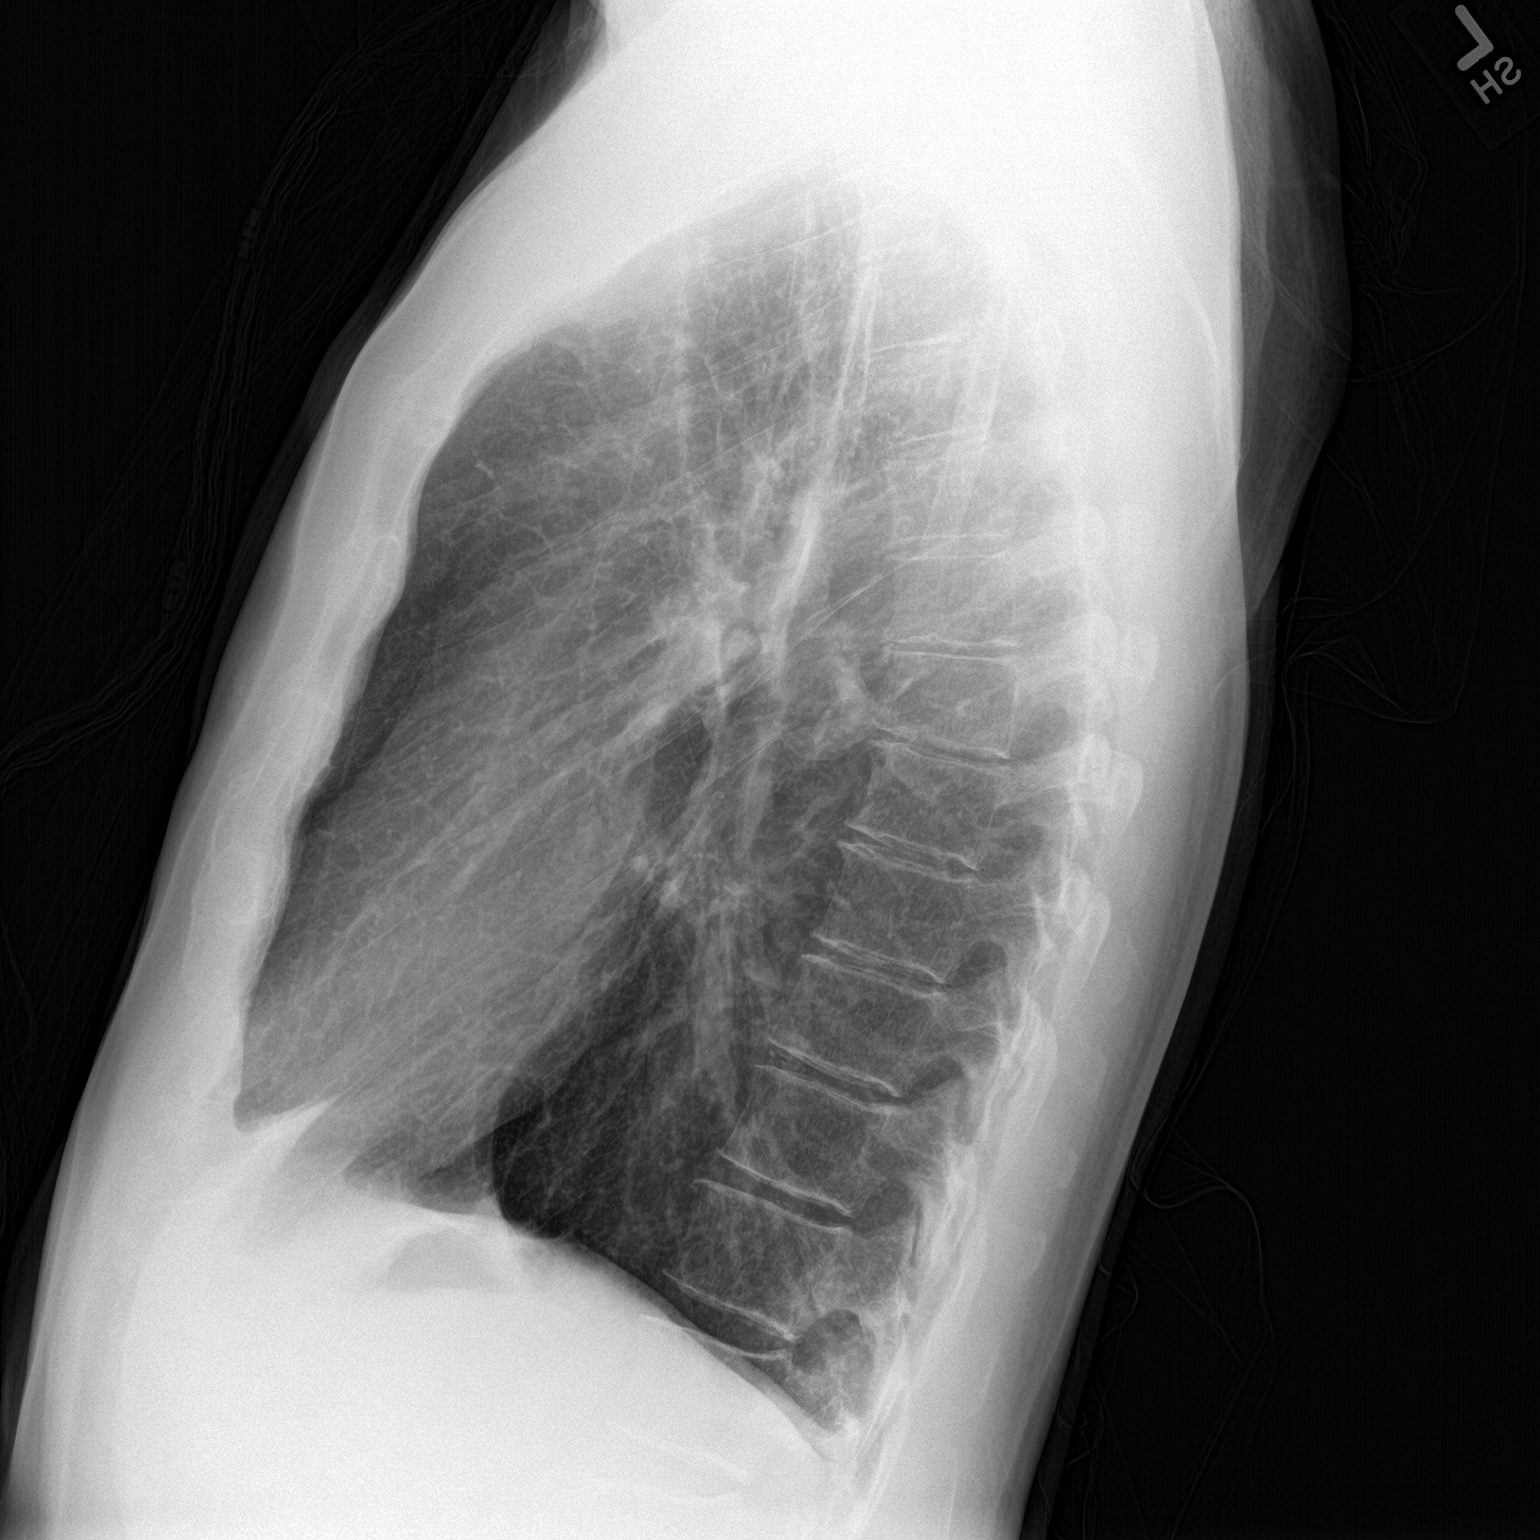

[2 of 2 positions shown; findings below may reference images not displayed]

FINDINGS: Mediastinum is normal. Bilateral upper lobe nodular pleural
parenchymal thickening with retraction of the hila noted consistent
with scarring, possibly from prior granulomas disease. No focal
alveolar infiltrate. No pleural effusion or pneumothorax. No acute
bony abnormality .
IMPRESSION: Bilateral upper lobe nodular pleural-parenchymal thickening with
hilar retraction consistent with scarring, possibly from prior
granulomas is a. No acute abnormality .
